# Patient Record
Sex: Male | Born: 1975 | Race: White | Hispanic: No | Marital: Married | Smoking: Never smoker
Health system: Southern US, Community
[De-identification: ages and names within clinical notes are randomized; demographics above are authoritative.]

## PROBLEM LIST (undated history)

## (undated) DIAGNOSIS — F909 Attention-deficit hyperactivity disorder, unspecified type: Secondary | ICD-10-CM

---

## 1999-06-18 ENCOUNTER — Emergency Department (HOSPITAL_COMMUNITY): Admission: EM | Admit: 1999-06-18 | Discharge: 1999-06-18 | Payer: Self-pay | Admitting: Emergency Medicine

## 2002-05-31 ENCOUNTER — Encounter: Payer: Self-pay | Admitting: Emergency Medicine

## 2002-05-31 ENCOUNTER — Emergency Department (HOSPITAL_COMMUNITY): Admission: EM | Admit: 2002-05-31 | Discharge: 2002-05-31 | Payer: Self-pay | Admitting: Emergency Medicine

## 2011-05-12 ENCOUNTER — Ambulatory Visit
Admission: RE | Admit: 2011-05-12 | Discharge: 2011-05-12 | Disposition: A | Discharge status: Home / Self Care | Payer: Managed Care, Other (non HMO) | Source: Ambulatory Visit | Attending: Physician Assistant | Admitting: Physician Assistant

## 2011-05-12 ENCOUNTER — Other Ambulatory Visit: Payer: Self-pay | Admitting: Physician Assistant

## 2011-05-12 DIAGNOSIS — M545 Low back pain: Secondary | ICD-10-CM

## 2012-06-15 ENCOUNTER — Other Ambulatory Visit: Payer: Self-pay | Admitting: Family Medicine

## 2012-06-15 DIAGNOSIS — N50811 Right testicular pain: Secondary | ICD-10-CM

## 2012-06-16 ENCOUNTER — Ambulatory Visit
Admission: RE | Admit: 2012-06-16 | Discharge: 2012-06-16 | Disposition: A | Discharge status: Home / Self Care | Payer: Managed Care, Other (non HMO) | Source: Ambulatory Visit | Attending: Family Medicine | Admitting: Family Medicine

## 2012-06-16 DIAGNOSIS — N50811 Right testicular pain: Secondary | ICD-10-CM

## 2014-06-21 IMAGING — US US ART/VEN ABD/PELV/SCROTUM DOPPLER LTD
1 series · 14 of 25 positions shown · non-contrast
Comparison: None

CLINICAL DATA: Right testicular pain for several months

SCROTAL ULTRASOUND
DOPPLER ULTRASOUND OF THE TESTICLES
TECHNIQUE: Complete ultrasound examination of the testicles,
epididymis, and other scrotal structures was performed.  Color and
spectral Doppler ultrasound were also utilized to evaluate blood
flow to the testicles.

[Series 1: us art/ven abd/pelv/scrotum doppler ltd · 0.07mm/px · 14 of 44 slices shown]
[im 1/44]
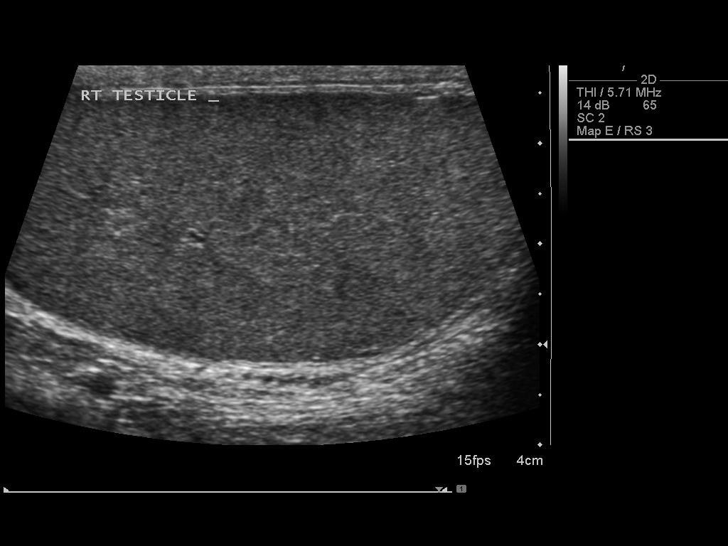
[im 4/44]
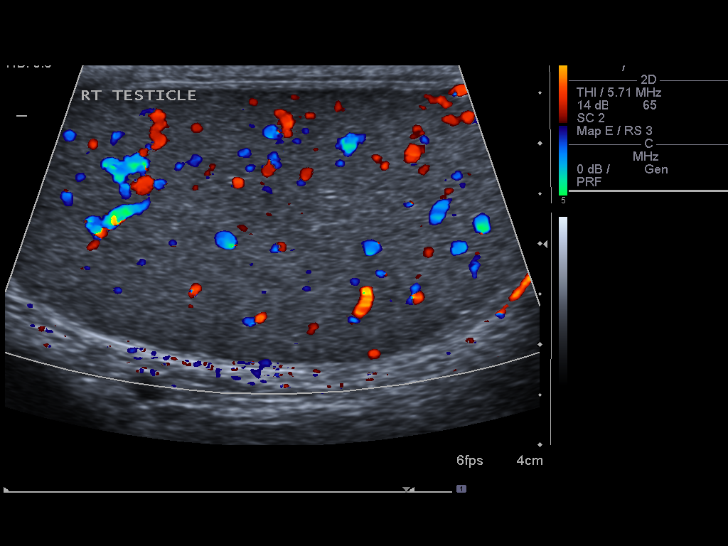
[im 8/44]
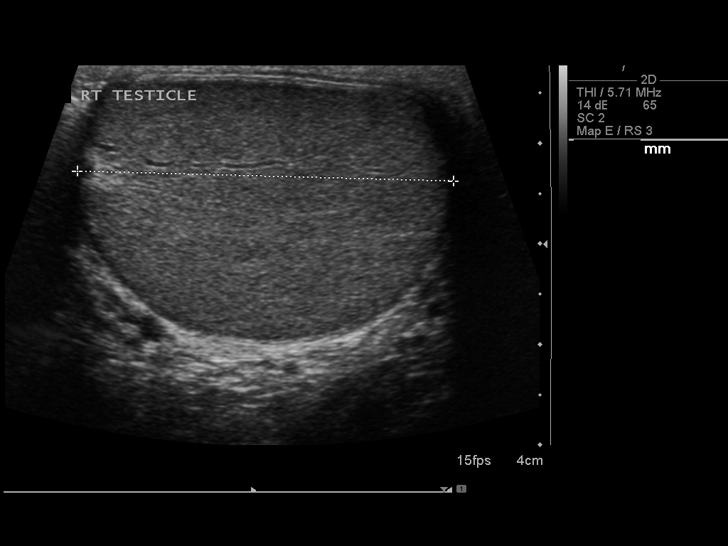
[im 11/44]
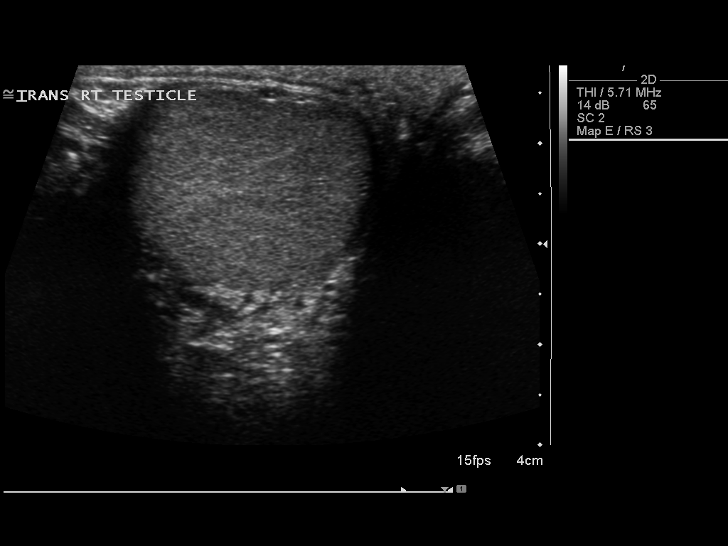
[im 15/44]
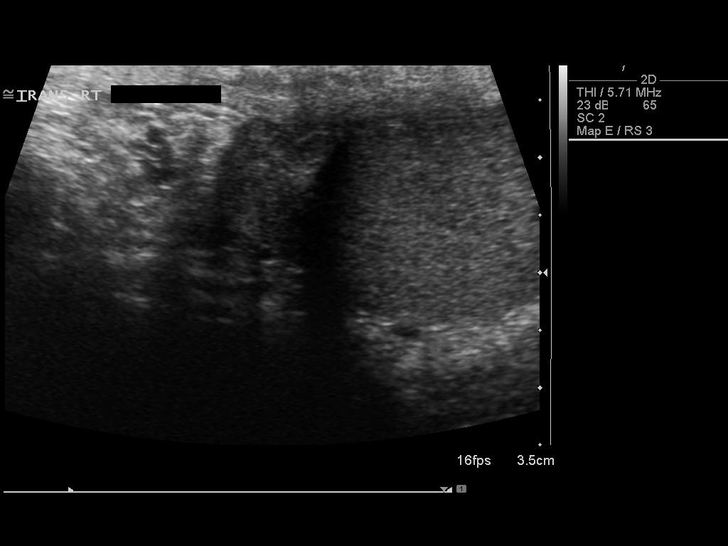
[im 17/44]
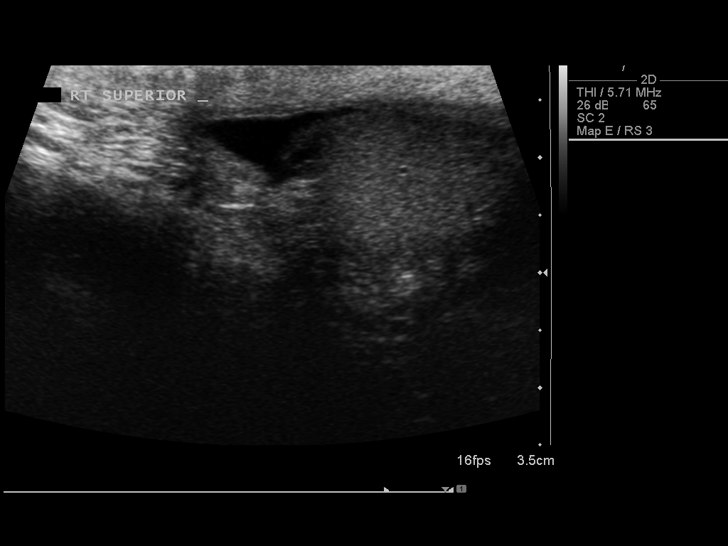
[im 20/44]
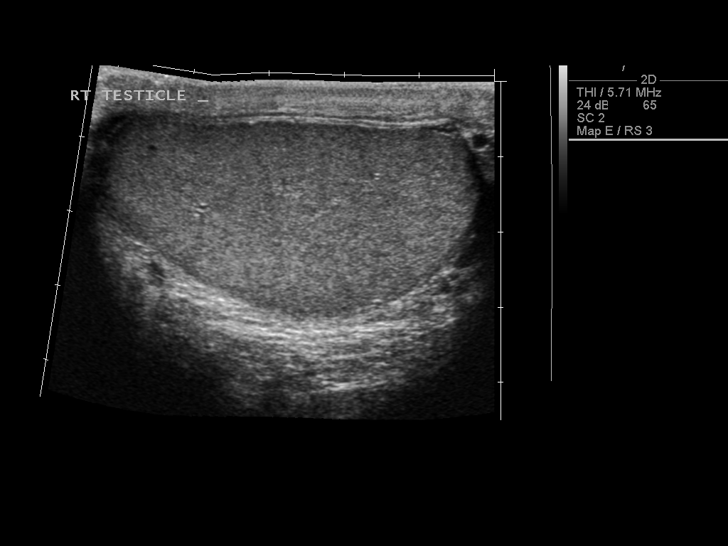
[im 24/44]
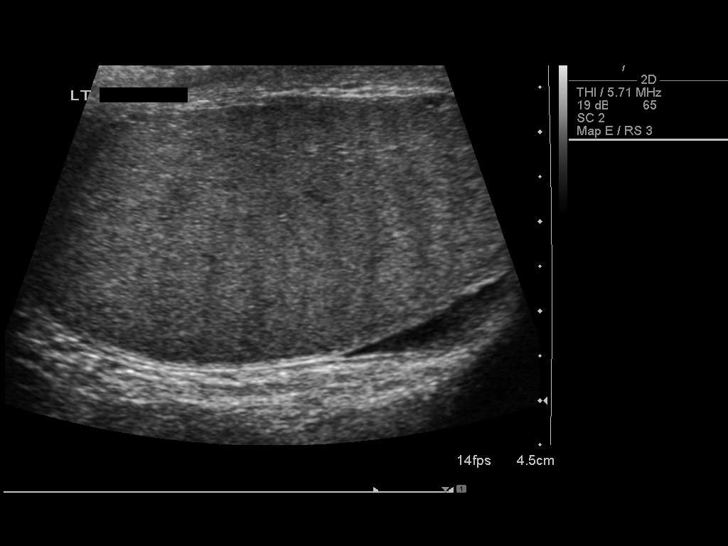
[im 27/44]
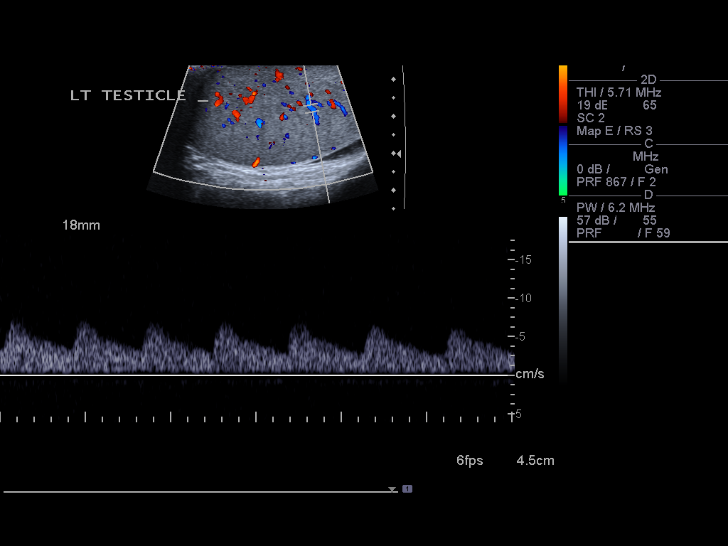
[im 29/44]
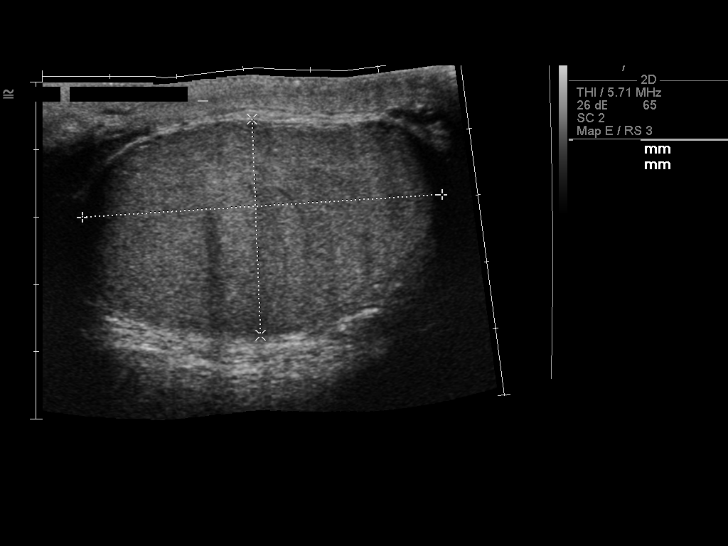
[im 33/44]
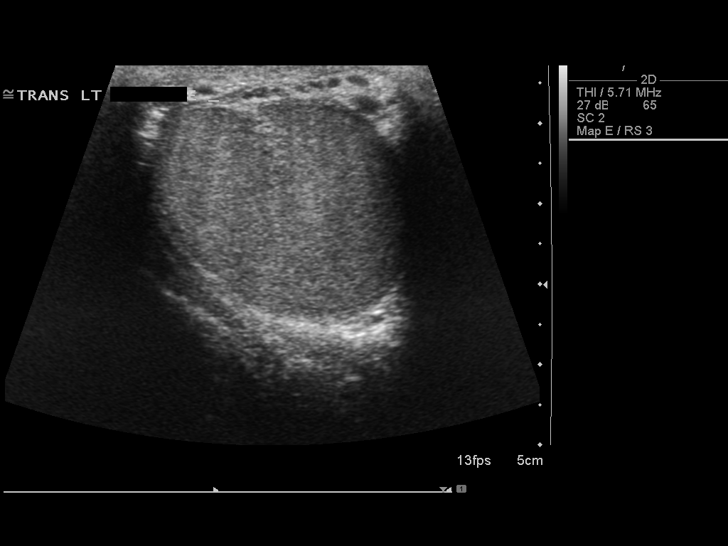
[im 36/44]
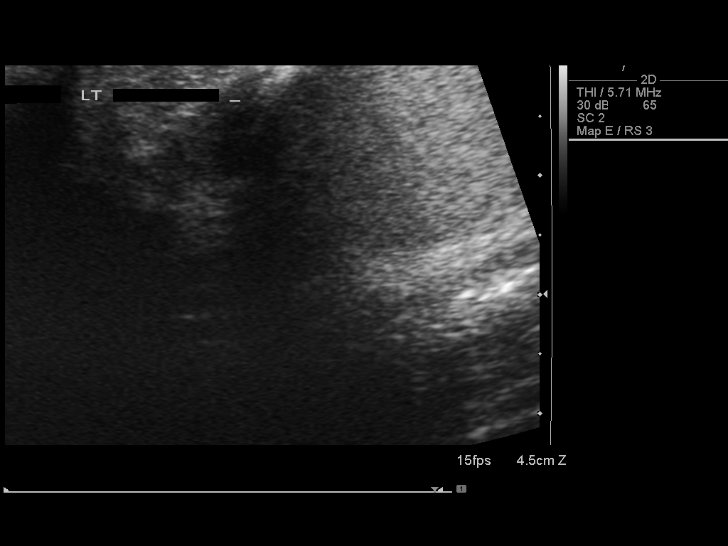
[im 40/44]
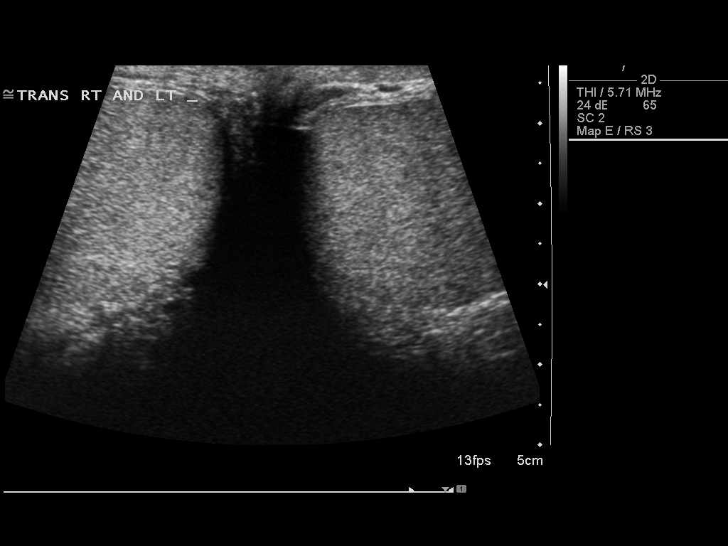
[im 44/44]
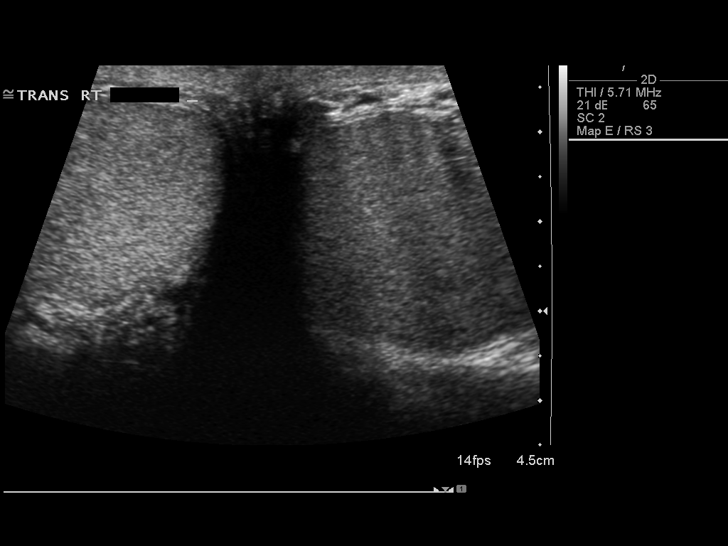

[14 of 25 positions shown; findings below may reference images not displayed]

FINDINGS: Right testis:  The right testicle is normal in size and
echogenicity.  Blood flow is demonstrated to the right testicle.

Left testis:  The left testicle also is normal in size and
echogenicity.  Blood flow is demonstrated to the left testicle as
well.

Right epididymis:  Right epididymis is unremarkable.

Left epididymis:  The left epididymis is unremarkable.

Hydrocele:  Only a small amount of fluid is present bilaterally.

Varicocele:  No varicocele is seen.

Pulsed Doppler interrogation of both testes demonstrates low
resistance flow bilaterally.
IMPRESSION: 1.  No intratesticular abnormality is noted.  Blood flow is noted
to both testicles.
2.  Only a small amount of fluid is noted bilaterally.

## 2021-07-10 ENCOUNTER — Ambulatory Visit
Admission: RE | Admit: 2021-07-10 | Discharge: 2021-07-10 | Disposition: A | Discharge status: Home / Self Care | Payer: Managed Care, Other (non HMO) | Source: Ambulatory Visit

## 2021-07-10 ENCOUNTER — Other Ambulatory Visit: Payer: Self-pay

## 2021-07-10 VITALS — BP 142/92 | HR 87 | Temp 98.3°F | Resp 18

## 2021-07-10 DIAGNOSIS — R0781 Pleurodynia: Secondary | ICD-10-CM | POA: Diagnosis not present

## 2021-07-10 NOTE — Discharge Instructions (Addendum)
Please go to ER if symptoms worsen and follow-up with primary care if they persist.

## 2021-07-10 NOTE — ED Provider Notes (Signed)
[name]    CSN: [id number] Arrival date & time: 07/10/21  1757      History   Chief Complaint Chief Complaint  Patient presents with   Pain    HPI RANEN BALAN is a 46 y.o. male.   Patient presents with left-sided rib pain with a "bump" to area of pain.  Denies any apparent injury to the area.  Pain is described as a "burning pain".  It is rated 2/10 on pain scale and is intermittent.  Certain movements exacerbate pain.  He reports that eating also exacerbates pain.  Denies nausea, vomiting, diarrhea, chest pain, shortness of breath, headache, dizziness, blurred vision.  Patient denies that this has occurred before.  Patient requesting x-ray.    History reviewed. No pertinent past medical history.  There are no problems to display for this patient.   History reviewed. No pertinent surgical history.     Home Medications    Prior to Admission medications   Medication Sig Start Date End Date Taking? Authorizing Provider  amphetamine-dextroamphetamine (ADDERALL) 20 MG tablet Take by mouth. 06/24/21   [provider]  sildenafil (REVATIO) 20 MG tablet 2-5 tablets    [provider]    Family History History reviewed. No pertinent family history.  Social History Social History   Tobacco Use   Smoking status: Never   Smokeless tobacco: Never     Allergies   Patient has no allergy information on record.   Review of Systems Review of Systems Per HPI  Physical Exam Triage Vital Signs ED Triage Vitals [07/10/21 1828]  Enc Vitals Group     BP (!) 142/92     Pulse Rate 87     Resp 18     Temp 98.3 F (36.8 C)     Temp Source Oral     SpO2 97 %     Weight      Height      Head Circumference      Peak Flow      Pain Score 0     Pain Loc      Pain Edu?      Excl. in [place]?    No data found.  Updated Vital Signs BP (!) 142/92 (BP Location: Left Arm)    Pulse 87    Temp 98.3 F (36.8 C) (Oral)    Resp 18    SpO2 97%    Visual Acuity Right Eye Distance:   Left Eye Distance:   Bilateral Distance:    Right Eye Near:   Left Eye Near:    Bilateral Near:     Physical Exam Constitutional:      General: He is not in acute distress.    Appearance: Normal appearance. He is not toxic-appearing or diaphoretic.  HENT:     Head: Normocephalic and atraumatic.  Eyes:     Extraocular Movements: Extraocular movements intact.     Conjunctiva/sclera: Conjunctivae normal.  Cardiovascular:     Rate and Rhythm: Normal rate and regular rhythm.     Pulses: Normal pulses.     Heart sounds: Normal heart sounds.  Pulmonary:     Effort: Pulmonary effort is normal. No respiratory distress.     Breath sounds: Normal breath sounds.  Chest:     Chest wall: Tenderness present.     Comments: Tenderness to palpation to left lateral lower ribs.  There is a protrusion located to left upper quadrant at left rib cage.  It is  soft and nonmobile. Neurological:     General: No focal deficit present.     Mental Status: He is alert and oriented to person, place, and time. Mental status is at baseline.  Psychiatric:        Mood and Affect: Mood normal.        Behavior: Behavior normal.        Thought Content: Thought content normal.        Judgment: Judgment normal.     UC Treatments / Results  Labs (all labs ordered are listed, but only abnormal results are displayed) Labs Reviewed - No data to display  EKG   Radiology No results found.  Procedures Procedures (including critical care time)  Medications Ordered in UC Medications - No data to display  Initial Impression / Assessment and Plan / UC Course  I have reviewed the triage vital signs and the nursing notes.  Pertinent labs & imaging results that were available during my care of the patient were reviewed by me and considered in my medical decision making (see chart for details).     Differential diagnoses include left rib abnormality or lipoma.  Pain is  not severe so do not think the patient is in need of immediate medical attention at the hospital at this time.  I do think it is reasonable to start with a chest x-ray to rule out rib abnormalities.  Do not have x-ray tech in urgent care today.  Patient was offered outpatient imaging but declined.  Risks associated with not doing imaging were discussed with patient.  Patient states prior to discharge that he would rather follow-up with PCP for further evaluation and management.  Discussed return precautions.  Patient verbalized understanding and was agreeable with plan. Final Clinical Impressions(s) / UC Diagnoses   Final diagnoses:  Rib pain on left side     Discharge Instructions      Please go to ER if symptoms worsen and follow-up with primary care if they persist.    ED Prescriptions   None    PDMP not reviewed this encounter.   Teodora Medici, [place] 07/10/21 1911

## 2021-07-10 NOTE — ED Triage Notes (Signed)
Pt c/o pain to left midaxillary line around floating ribs for about 1 month states he feels a "lump" that's tender there and a burning sensation with occasional sharp pain. Wife, from home, states "don't leave without an xray."

## 2021-09-08 ENCOUNTER — Other Ambulatory Visit: Payer: Self-pay | Admitting: Family Medicine

## 2021-09-08 DIAGNOSIS — R229 Localized swelling, mass and lump, unspecified: Secondary | ICD-10-CM

## 2021-09-15 ENCOUNTER — Ambulatory Visit
Admission: RE | Admit: 2021-09-15 | Discharge: 2021-09-15 | Disposition: A | Discharge status: Home / Self Care | Payer: Managed Care, Other (non HMO) | Source: Ambulatory Visit | Attending: Family Medicine | Admitting: Family Medicine

## 2021-09-15 DIAGNOSIS — R229 Localized swelling, mass and lump, unspecified: Secondary | ICD-10-CM

## 2021-11-03 ENCOUNTER — Encounter: Payer: Self-pay | Admitting: *Deleted

## 2024-01-01 ENCOUNTER — Ambulatory Visit: Admission: EM | Admit: 2024-01-01 | Discharge: 2024-01-01 | Disposition: A | Discharge status: Home / Self Care

## 2024-01-01 DIAGNOSIS — R42 Dizziness and giddiness: Secondary | ICD-10-CM

## 2024-01-01 DIAGNOSIS — R030 Elevated blood-pressure reading, without diagnosis of hypertension: Secondary | ICD-10-CM | POA: Diagnosis not present

## 2024-01-01 HISTORY — DX: Attention-deficit hyperactivity disorder, unspecified type: F90.9

## 2024-01-01 NOTE — ED Notes (Signed)
 Rhythm strip obtained. No previous EKG Hx @ [place].

## 2024-01-01 NOTE — ED Triage Notes (Signed)
 I have been having increased readings in my BP at home especially the last few days, I have been having increased episodes of dizziness, feeling weird at work with exertion. No chest pain. No sob.

## 2024-01-04 NOTE — ED Provider Notes (Addendum)
 [facility]    CSN: [id number] Arrival date & time: 01/01/24  1322      History   Chief Complaint Chief Complaint  Patient presents with   Blood Pressure Concern    HPI Allen Ortiz is a 48 y.o. male.   Patient here today for evaluation of elevated blood pressure readings at home.  He reports that he is concerned with elevated readings. He denise any chest pain or shortness of breath. He does note feeling somewhat weird at work with exertion but states it may be due to extreme heat as well.   The history is provided by the patient.    Past Medical History:  Diagnosis Date   ADHD     There are no active problems to display for this patient.   History reviewed. No pertinent surgical history.     Home Medications    Prior to Admission medications   Medication Sig Start Date End Date Taking? Authorizing Provider  amphetamine-dextroamphetamine (ADDERALL) 30 MG tablet Take 30 mg by mouth 2 (two) times daily. But I usually only do 1x per day. 12/28/23  Yes [provider]  sildenafil (REVATIO) 20 MG tablet 2-5 tablets   Yes [provider]  amphetamine-dextroamphetamine (ADDERALL) 20 MG tablet Take by mouth. 06/24/21   [provider]    Family History Family History  Problem Relation Age of Onset   Cancer Mother    Heart attack Father    Colon polyps Father     Social History Social History   Tobacco Use   Smoking status: Former    Types: Cigarettes   Smokeless tobacco: Never  Vaping Use   Vaping status: Never Used  Substance Use Topics   Alcohol use: Yes    Comment: Occassionally. 1x per year.   Drug use: Not Currently     Allergies   Patient has no known allergies.   Review of Systems Review of Systems  Constitutional:  Negative for chills and fever.  Eyes:  Negative for discharge and redness.  Respiratory:  Negative for shortness of breath.   Cardiovascular:  Negative for chest pain.   Neurological:  Negative for numbness.     Physical Exam Triage Vital Signs ED Triage Vitals  Encounter Vitals Group     BP 01/01/24 1338 127/83     Girls Systolic BP Percentile --      Girls Diastolic BP Percentile --      Boys Systolic BP Percentile --      Boys Diastolic BP Percentile --      Pulse Rate 01/01/24 1338 71     Resp 01/01/24 1338 18     Temp 01/01/24 1338 97.7 F (36.5 C)     Temp Source 01/01/24 1338 Oral     SpO2 01/01/24 1338 98 %     Weight 01/01/24 1333 235 lb (106.6 kg)     Height 01/01/24 1333 6' 2 (1.88 m)     Head Circumference --      Peak Flow --      Pain Score 01/01/24 1329 0     Pain Loc --      Pain Education --      Exclude from Growth Chart --    No data found.  Updated Vital Signs BP 127/83 (BP Location: Right Arm)   Pulse 71   Temp 97.7 F (36.5 C) (Oral)   Resp 18   Ht 6' 2 (1.88 m)   Wt 235 lb (  106.6 kg)   SpO2 98%   BMI 30.17 kg/m   Visual Acuity Right Eye Distance:   Left Eye Distance:   Bilateral Distance:    Right Eye Near:   Left Eye Near:    Bilateral Near:     Physical Exam Vitals and nursing note reviewed.  Constitutional:      General: He is not in acute distress.    Appearance: Normal appearance. He is not ill-appearing.  HENT:     Head: Normocephalic and atraumatic.  Eyes:     Conjunctiva/sclera: Conjunctivae normal.  Cardiovascular:     Rate and Rhythm: Normal rate and regular rhythm.  Pulmonary:     Effort: Pulmonary effort is normal. No respiratory distress.     Breath sounds: Normal breath sounds. No wheezing, rhonchi or rales.  Neurological:     Mental Status: He is alert.  Psychiatric:        Mood and Affect: Mood normal.        Behavior: Behavior normal.        Thought Content: Thought content normal.      UC Treatments / Results  Labs (all labs ordered are listed, but only abnormal results are displayed) Labs Reviewed - No data to display  EKG   Radiology No results  found.  Procedures Procedures (including critical Ortiz time)  Medications Ordered in UC Medications - No data to display  Initial Impression / Assessment and Plan / UC Course  I have reviewed the triage vital signs and the nursing notes.  Pertinent labs & imaging results that were available during my Ortiz of the patient were reviewed by me and considered in my medical decision making (see chart for details).    EKG without concerning findings. BP normal in office. Reassured patient of same.  Encouraged follow up with PCP and recording his BP at home in the interim. Recommend sooner follow up with any further concerns.   Final Clinical Impressions(s) / UC Diagnoses   Final diagnoses:  Elevated blood-pressure reading without diagnosis of hypertension  Episodic lightheadedness   Discharge Instructions   None    ED Prescriptions   None    PDMP not reviewed this encounter.   Billy Asberry FALCON, PA-C 01/04/24 1804    Billy Asberry FALCON, PA-C 01/04/24 [id number]
